# Patient Record
Sex: Male | Born: 1980 | Race: Black or African American | Hispanic: No | Marital: Single | Smoking: Never smoker
Health system: Southern US, Community
[De-identification: ages and names within clinical notes are randomized; demographics above are authoritative.]

## PROBLEM LIST (undated history)

## (undated) DIAGNOSIS — C8590 Non-Hodgkin lymphoma, unspecified, unspecified site: Secondary | ICD-10-CM

---

## 2005-09-16 ENCOUNTER — Emergency Department (HOSPITAL_COMMUNITY): Admission: EM | Admit: 2005-09-16 | Discharge: 2005-09-16 | Payer: Self-pay | Admitting: Emergency Medicine

## 2012-09-06 ENCOUNTER — Emergency Department (HOSPITAL_COMMUNITY)
Admission: EM | Admit: 2012-09-06 | Discharge: 2012-09-06 | Disposition: A | Discharge status: Home / Self Care | Payer: No Typology Code available for payment source | Attending: Emergency Medicine | Admitting: Emergency Medicine

## 2012-09-06 DIAGNOSIS — M5380 Other specified dorsopathies, site unspecified: Secondary | ICD-10-CM | POA: Insufficient documentation

## 2012-09-06 DIAGNOSIS — M545 Low back pain: Secondary | ICD-10-CM

## 2012-09-06 DIAGNOSIS — M62830 Muscle spasm of back: Secondary | ICD-10-CM

## 2012-09-06 DIAGNOSIS — S39012A Strain of muscle, fascia and tendon of lower back, initial encounter: Secondary | ICD-10-CM

## 2012-09-06 DIAGNOSIS — S335XXA Sprain of ligaments of lumbar spine, initial encounter: Secondary | ICD-10-CM | POA: Insufficient documentation

## 2012-09-06 DIAGNOSIS — Y92410 Unspecified street and highway as the place of occurrence of the external cause: Secondary | ICD-10-CM | POA: Insufficient documentation

## 2012-09-06 DIAGNOSIS — Y9389 Activity, other specified: Secondary | ICD-10-CM | POA: Insufficient documentation

## 2012-09-06 MED ORDER — NAPROXEN 500 MG PO TABS: 500.0000 mg | ORAL_TABLET | Freq: Two times a day (BID) | ORAL | Status: DC | PRN

## 2012-09-06 MED ORDER — METHOCARBAMOL 750 MG PO TABS: 750.0000 mg | ORAL_TABLET | Freq: Four times a day (QID) | ORAL | Status: DC | PRN

## 2012-09-06 NOTE — ED Provider Notes (Signed)
Medical screening examination/treatment/procedure(s) were performed by non-physician practitioner and as supervising physician I was immediately available for consultation/collaboration.    Carla Rashad R Shaquasia Caponigro, MD 09/06/12 2331 
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
## 2013-02-05 ENCOUNTER — Emergency Department (HOSPITAL_COMMUNITY): Payer: BC Managed Care – PPO

## 2013-02-05 ENCOUNTER — Emergency Department (HOSPITAL_COMMUNITY)
Admission: EM | Admit: 2013-02-05 | Discharge: 2013-02-05 | Disposition: A | Discharge status: Home / Self Care | Payer: BC Managed Care – PPO | Attending: Emergency Medicine | Admitting: Emergency Medicine

## 2013-02-05 ENCOUNTER — Encounter (HOSPITAL_COMMUNITY): Payer: Self-pay | Admitting: Family Medicine

## 2013-02-05 DIAGNOSIS — S8992XA Unspecified injury of left lower leg, initial encounter: Secondary | ICD-10-CM

## 2013-02-05 DIAGNOSIS — Z87828 Personal history of other (healed) physical injury and trauma: Secondary | ICD-10-CM | POA: Insufficient documentation

## 2013-02-05 DIAGNOSIS — Y9302 Activity, running: Secondary | ICD-10-CM | POA: Insufficient documentation

## 2013-02-05 DIAGNOSIS — S8990XA Unspecified injury of unspecified lower leg, initial encounter: Secondary | ICD-10-CM | POA: Insufficient documentation

## 2013-02-05 DIAGNOSIS — X503XXA Overexertion from repetitive movements, initial encounter: Secondary | ICD-10-CM | POA: Insufficient documentation

## 2013-02-05 DIAGNOSIS — Y9289 Other specified places as the place of occurrence of the external cause: Secondary | ICD-10-CM | POA: Insufficient documentation

## 2013-02-05 DIAGNOSIS — Z87898 Personal history of other specified conditions: Secondary | ICD-10-CM | POA: Insufficient documentation

## 2013-02-05 HISTORY — DX: Non-Hodgkin lymphoma, unspecified, unspecified site: C85.90

## 2013-02-05 MED ORDER — IBUPROFEN 800 MG PO TABS: 800.0000 mg | ORAL_TABLET | Freq: Four times a day (QID) | ORAL | Status: DC | PRN

## 2013-02-05 MED ORDER — IBUPROFEN 800 MG PO TABS
800.0000 mg | ORAL_TABLET | Freq: Once | ORAL | Status: AC
  Administered 2013-02-05: 800 mg via ORAL
  Filled 2013-02-05: qty 1

## 2013-02-05 NOTE — ED Notes (Signed)
Ortho tech at bedside to apply knee immobilizer.  
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
## 2013-02-05 NOTE — ED Notes (Signed)
Patient states he has had left knee pain x 3 weeks. States that he has recently started running and pain started after that. States pain with movement and weight-bearing. Has not taken any meds for pain.

## 2013-02-05 NOTE — ED Notes (Signed)
Patient transported to X-ray 

## 2013-02-07 NOTE — ED Provider Notes (Signed)
Medical screening examination/treatment/procedure(s) were performed by non-physician practitioner and as supervising physician I was immediately available for consultation/collaboration.   Nazyia Gaugh M Latoria Dry, MD 02/07/13 2251 

## 2014-01-29 IMAGING — CR DG KNEE COMPLETE 4+V*L*
4 series · 4 of 4 positions shown · non-contrast
Comparison: None.

CLINICAL DATA: Hurt knee during exercise

LEFT KNEE - COMPLETE 4+ VIEW

[t knee ap left]
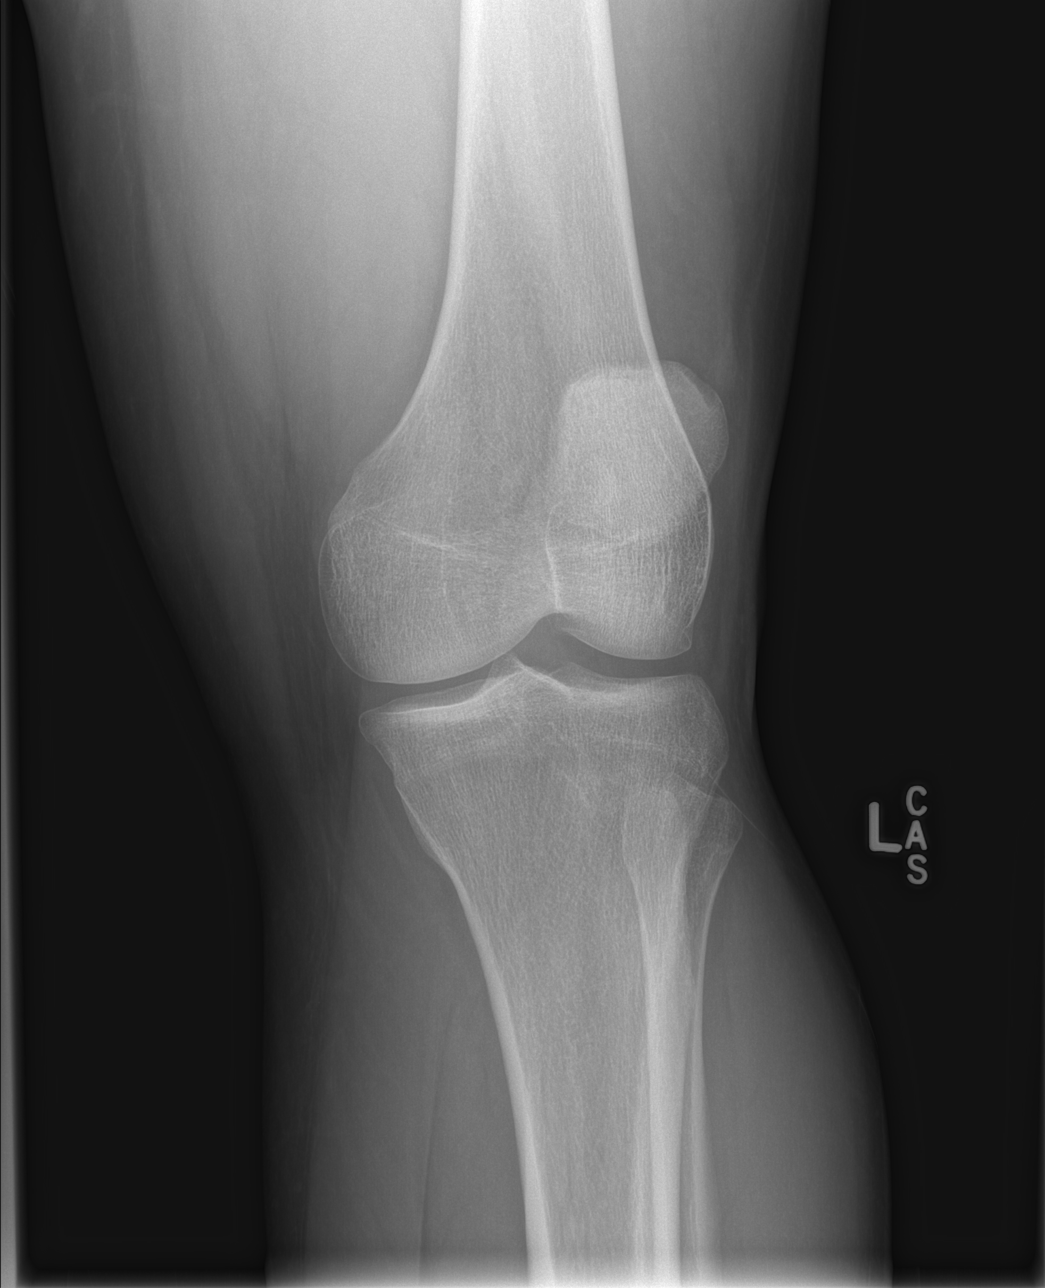

[t knee obl left (1 of 2)]
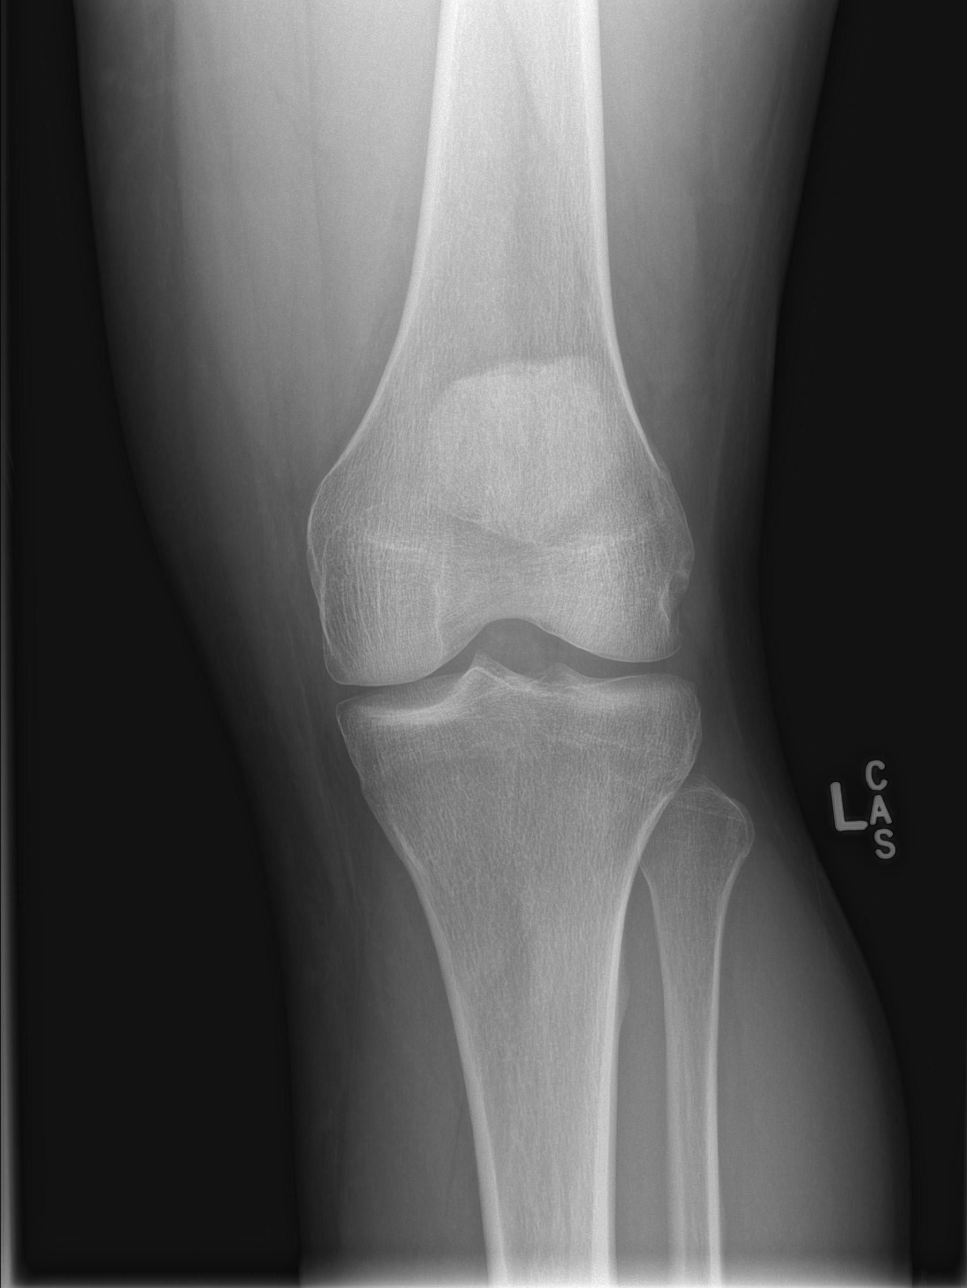

[t knee obl left (2 of 2)]
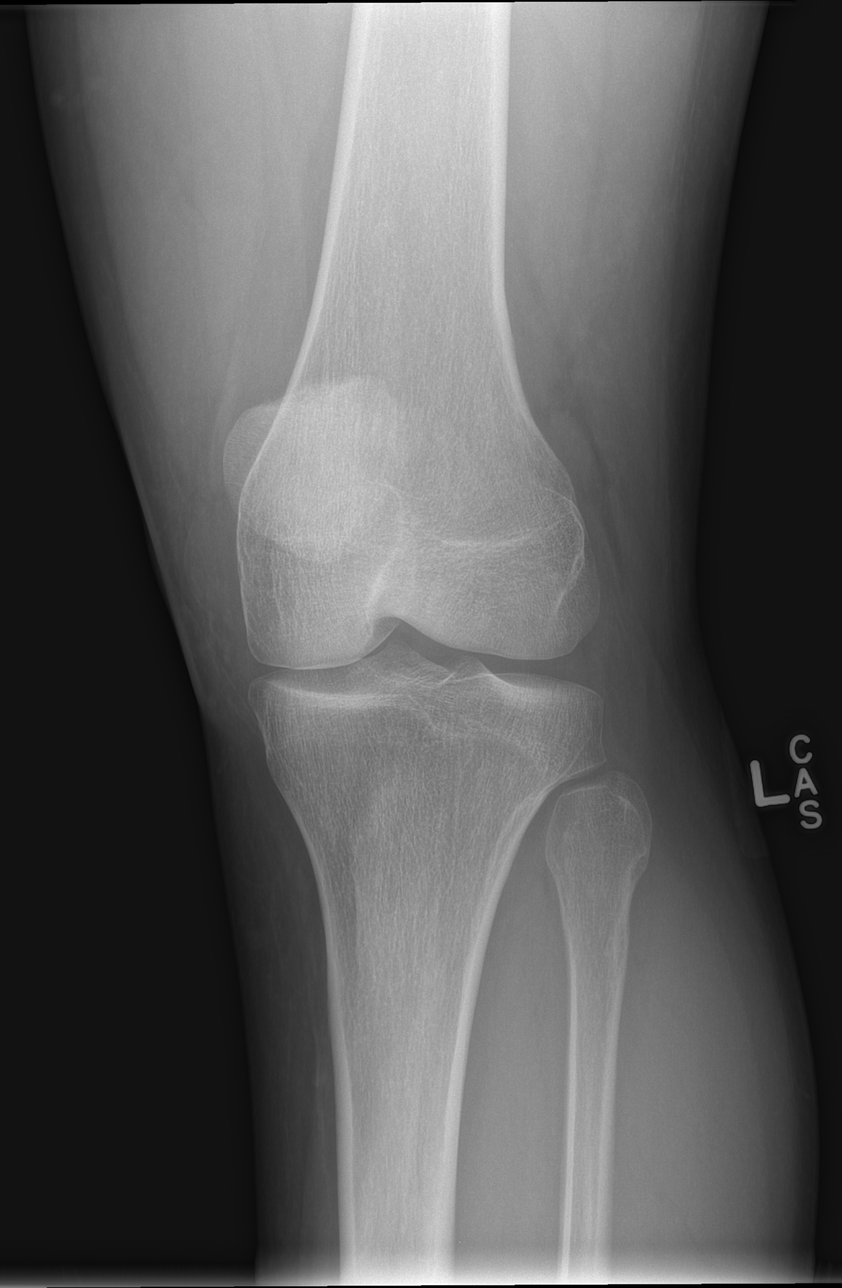

[t knee lat left]
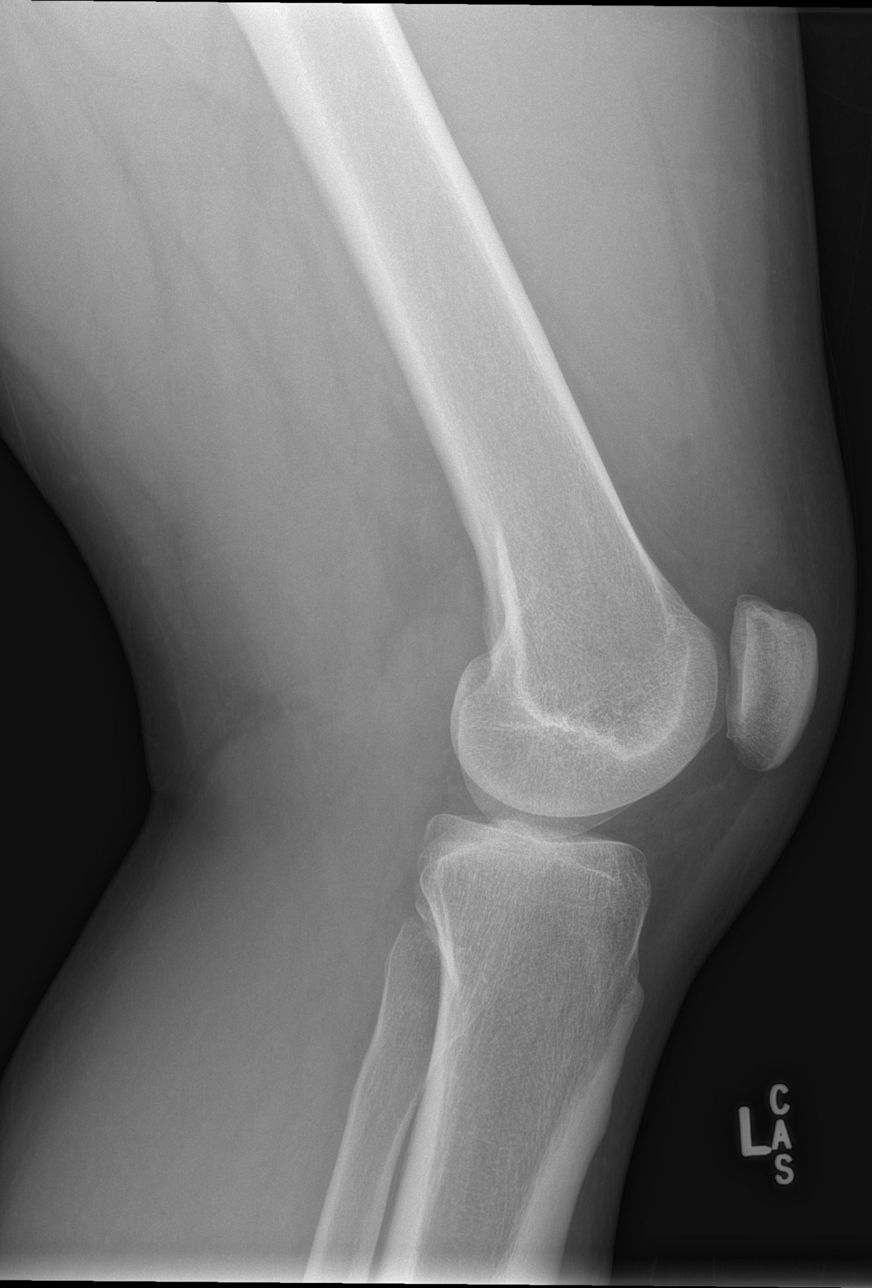

[4 of 4 positions shown; findings below may reference images not displayed]

FINDINGS: No fracture or dislocation.  Joint spaces are preserved.
No erosions.  No evidence of chondrocalcinosis.  No joint effusion.
Regional soft tissues are normal.
IMPRESSION: Normal radiographs of the left knee.

## 2014-11-19 ENCOUNTER — Emergency Department (HOSPITAL_COMMUNITY)
Admission: EM | Admit: 2014-11-19 | Discharge: 2014-11-19 | Disposition: A | Discharge status: Home / Self Care | Payer: BLUE CROSS/BLUE SHIELD | Source: Home / Self Care | Attending: Family Medicine | Admitting: Family Medicine

## 2014-11-19 ENCOUNTER — Encounter (HOSPITAL_COMMUNITY): Payer: Self-pay | Admitting: Emergency Medicine

## 2014-11-19 DIAGNOSIS — H109 Unspecified conjunctivitis: Secondary | ICD-10-CM

## 2014-11-19 MED ORDER — TOBRAMYCIN 0.3 % OP SOLN
OPHTHALMIC | Status: AC
  Filled 2014-11-19: qty 5

## 2014-11-19 MED ORDER — TOBRAMYCIN 0.3 % OP SOLN
1.0000 [drp] | Freq: Four times a day (QID) | OPHTHALMIC | Status: DC
  Administered 2014-11-19: 1 [drp] via OPHTHALMIC

## 2014-11-19 MED ORDER — TETRACAINE HCL 0.5 % OP SOLN
OPHTHALMIC | Status: AC
  Filled 2014-11-19: qty 2

## 2014-11-19 NOTE — ED Provider Notes (Signed)
CSN: [id number]     Arrival date & time 11/19/14  1855 History   First MD Initiated Contact with Patient 11/19/14 2010     Chief Complaint  Patient presents with  . Conjunctivitis   (Consider location/radiation/quality/duration/timing/severity/associated sxs/prior Treatment) Patient is a 34 y.o. male presenting with conjunctivitis. The history is provided by the patient.  Conjunctivitis This is a new problem. The current episode started 6 to 12 hours ago. The problem has not changed since onset.Pertinent negatives include no chest pain and no abdominal pain. Associated symptoms comments: Pt with allergies, sinus problems and exposure to co-worker recently with pink eye.Marland Kitchen    Past Medical History  Diagnosis Date  . Non Hodgkin's lymphoma    History reviewed. No pertinent past surgical history. No family history on file. History  Substance Use Topics  . Smoking status: Never Smoker   . Smokeless tobacco: Not on file  . Alcohol Use: Yes     Comment: Occasional    Review of Systems  Constitutional: Negative.   HENT: Negative.   Eyes: Positive for discharge and redness. Negative for photophobia, pain, itching and visual disturbance.  Respiratory: Negative.   Cardiovascular: Negative.  Negative for chest pain.  Gastrointestinal: Negative for abdominal pain.    Allergies  Review of patient's allergies indicates no known allergies.  Home Medications   Prior to Admission medications   Medication Sig Start Date End Date Taking? Authorizing Provider  aspirin EC 81 MG tablet Take 81 mg by mouth every 6 (six) hours as needed for pain. Headache    Historical Provider, MD  ibuprofen (ADVIL,MOTRIN) 800 MG tablet Take 1 tablet (800 mg total) by mouth every 6 (six) hours as needed for pain. 02/05/13   Junius Creamer, NP  methocarbamol (ROBAXIN) 750 MG tablet Take 1 tablet (750 mg total) by mouth 4 (four) times daily as needed (Take 1 tablet every 6 hours as needed for muscle spasms.). 09/06/12    Hannah Muthersbaugh, PA-C  Multiple Vitamin (MULTIVITAMIN WITH MINERALS) TABS Take 1 tablet by mouth daily.    Historical Provider, MD  naproxen (NAPROSYN) 500 MG tablet Take 1 tablet (500 mg total) by mouth 2 (two) times daily as needed. 09/06/12   Hannah Muthersbaugh, PA-C   BP 146/91 mmHg  Pulse 74  Temp(Src) 98.2 F (36.8 C) (Oral)  Resp 16  SpO2 100% Physical Exam  Constitutional: He is oriented to person, place, and time. He appears well-developed and well-nourished. No distress.  HENT:  Head: Normocephalic.  Right Ear: External ear normal.  Left Ear: External ear normal.  Mouth/Throat: Oropharynx is clear and moist.  Eyes: EOM and lids are normal. Pupils are equal, round, and reactive to light. Lids are everted and swept, no foreign bodies found. No foreign body present in the left eye. Right conjunctiva is not injected. Left conjunctiva is injected. Left conjunctiva has no hemorrhage.  Neck: Normal range of motion. Neck supple.  Lymphadenopathy:    He has no cervical adenopathy.  Neurological: He is alert and oriented to person, place, and time.  Skin: Skin is warm and dry.  Nursing note and vitals reviewed.   ED Course  Procedures (including critical care time) Labs Review Labs Reviewed - No data to display  Imaging Review No results found.   MDM   1. Conjunctivitis of left eye        Billy Fischer, MD 11/19/14 2033

## 2014-11-19 NOTE — Discharge Instructions (Signed)
Use cool cloth soak to eye before using eye medicine, see eye doctor if further problems

## 2014-11-19 NOTE — ED Notes (Addendum)
Left eye red, noticed this am.  Patient reports being around a co-worker a few days ago that had pink eye.  No change in vision.  Does not wear contacts.  Eye is tearing

## 2016-08-06 ENCOUNTER — Emergency Department (HOSPITAL_COMMUNITY)
Admission: EM | Admit: 2016-08-06 | Discharge: 2016-08-06 | Disposition: A | Discharge status: Home / Self Care | Payer: BC Managed Care – PPO | Attending: Emergency Medicine | Admitting: Emergency Medicine

## 2016-08-06 ENCOUNTER — Emergency Department (HOSPITAL_COMMUNITY): Payer: BC Managed Care – PPO

## 2016-08-06 ENCOUNTER — Encounter (HOSPITAL_COMMUNITY): Payer: Self-pay | Admitting: *Deleted

## 2016-08-06 DIAGNOSIS — Z7982 Long term (current) use of aspirin: Secondary | ICD-10-CM | POA: Diagnosis not present

## 2016-08-06 DIAGNOSIS — Z79899 Other long term (current) drug therapy: Secondary | ICD-10-CM | POA: Insufficient documentation

## 2016-08-06 DIAGNOSIS — R69 Illness, unspecified: Secondary | ICD-10-CM

## 2016-08-06 DIAGNOSIS — M7918 Myalgia, other site: Secondary | ICD-10-CM

## 2016-08-06 DIAGNOSIS — J111 Influenza due to unidentified influenza virus with other respiratory manifestations: Secondary | ICD-10-CM | POA: Diagnosis not present

## 2016-08-06 DIAGNOSIS — R05 Cough: Secondary | ICD-10-CM | POA: Diagnosis present

## 2016-08-06 MED ORDER — OSELTAMIVIR PHOSPHATE 75 MG PO CAPS: 75.0000 mg | ORAL_CAPSULE | Freq: Two times a day (BID) | ORAL | 0 refills | Status: DC

## 2016-08-06 MED ORDER — HYDROCODONE-ACETAMINOPHEN 5-325 MG PO TABS: ORAL_TABLET | ORAL | 0 refills | Status: DC

## 2016-08-06 NOTE — Discharge Instructions (Signed)

## 2016-08-06 NOTE — ED Triage Notes (Signed)
Pt reports generalized body aches and coughing since Tuesday.  Pt reports his abd hurts like he's done "a hundred crunches."  Pt reports coughing up clear mucus. Pt reports fever and chills.  Pt a/o x 4 and ambulatory.

## 2016-08-06 NOTE — ED Provider Notes (Signed)
[facility] DEPT Provider Note   CSN: [id number] Arrival date & time: 08/06/16  1932 By signing my name below, I, Jon Alexander, attest that this documentation has been prepared under the direction and in the presence of non-physician practitioner, Jon Blitz, PA-C. Electronically Signed: Dyke Alexander, Scribe. 08/06/2016. 8:18 PM.   History   Chief Complaint Chief Complaint  Patient presents with  . Cough   HPI Jon Alexander is a 36 y.o. male who presents to the Emergency Department complaining of intermittent, moderate generalized body aches which began two days ago. He notes associated cough productive of clear sputum, chills, subjective fever, and cramping abodminal pain secondary to cough. Pt has taken Alka Seltzer Cold and Flu with no relief of symptoms. He reports positive sick contact. Pt is a nonsmoker and is not on any daily medications. Pt denies nausea, vomiting, diarrhea, or dysuria. He has no other complaints at this time.   The history is provided by the patient. No language interpreter was used.   Past Medical History:  Diagnosis Date  . Non Hodgkin's lymphoma ([place])    There are no active problems to display for this patient.  History reviewed. No pertinent surgical history.   Home Medications    Prior to Admission medications   Medication Sig Start Date End Date Taking? Authorizing Provider  aspirin EC 81 MG tablet Take 81 mg by mouth every 6 (six) hours as needed for pain. Headache    Historical Provider, MD  HYDROcodone-acetaminophen (NORCO/VICODIN) 5-325 MG tablet Take 1-2 tablets by mouth every 6 hours as needed for pain. 08/06/16   Jon Trevino, PA-C  ibuprofen (ADVIL,MOTRIN) 800 MG tablet Take 1 tablet (800 mg total) by mouth every 6 (six) hours as needed for pain. 02/05/13   Jon Creamer, NP  methocarbamol (ROBAXIN) 750 MG tablet Take 1 tablet (750 mg total) by mouth 4 (four) times daily as needed (Take 1 tablet every 6 hours as needed for muscle  spasms.). 09/06/12   Jon Muthersbaugh, PA-C  Multiple Vitamin (MULTIVITAMIN WITH MINERALS) TABS Take 1 tablet by mouth daily.    Historical Provider, MD  naproxen (NAPROSYN) 500 MG tablet Take 1 tablet (500 mg total) by mouth 2 (two) times daily as needed. 09/06/12   Jon Muthersbaugh, PA-C  oseltamivir (TAMIFLU) 75 MG capsule Take 1 capsule (75 mg total) by mouth every 12 (twelve) hours. 08/06/16   Jon Ricks Sadie Pickar, PA-C    Family History No family history on file.  Social History Social History  Substance Use Topics  . Smoking status: Never Smoker  . Smokeless tobacco: Never Used  . Alcohol use Yes     Comment: Occasional    Allergies   Patient has no known allergies.  Review of Systems Review of Systems 10 systems reviewed and all are negative for acute change except as noted in the HPI.   Physical Exam Updated Vital Signs BP 144/86   Pulse 89   Temp 99.5 F (37.5 C)   Resp 16   SpO2 100%   Physical Exam  Constitutional: He is oriented to person, place, and time. He appears well-developed and well-nourished. No distress.  HENT:  Head: Normocephalic and atraumatic.  Right Ear: External ear normal.  Left Ear: External ear normal.  Mouth/Throat: Oropharynx is clear and moist. No oropharyngeal exudate.  No drooling or stridor. Posterior pharynx mildly erythematous no significant tonsillar hypertrophy. No exudate. Soft palate rises symmetrically. No TTP or induration under tongue.   No tenderness to palpation of frontal or  bilateral maxillary sinuses.  Mild mucosal edema in the nares with scant rhinorrhea.  Bilateral tympanic membranes with normal architecture and good light reflex.    Eyes: Conjunctivae and EOM are normal. Pupils are equal, round, and reactive to light.  Neck: Normal range of motion. Neck supple.  Cardiovascular: Normal rate and regular rhythm.   Pulmonary/Chest: Effort normal and breath sounds normal. No stridor. No respiratory distress. He has  no wheezes. He has no rales. He exhibits no tenderness.  Abdominal: Soft. He exhibits no distension. There is no tenderness. There is no rebound and no guarding.  Neurological: He is alert and oriented to person, place, and time.  Skin: Skin is warm and dry.  Psychiatric: He has a normal mood and affect.  Nursing note and vitals reviewed.  ED Treatments / Results  DIAGNOSTIC STUDIES:  Oxygen Saturation is 97% on RA, normal by my interpretation.    COORDINATION OF CARE:  8:17 PM Discussed treatment plan which includes [place] with pt at bedside and pt agreed to plan.   Labs (all labs ordered are listed, but only abnormal results are displayed) Labs Reviewed - No data to display  EKG  EKG Interpretation None       Radiology Dg Chest 2 View  Result Date: 08/06/2016 CLINICAL DATA:  Cough and mid chest pain with shortness of breath EXAM: CHEST  2 VIEW COMPARISON:  CT 09/16/2005, chest x-ray 09/16/2005 FINDINGS: Right-sided central venous catheter with tip projecting over the right subclavian region. Low lung volumes. No acute infiltrate or effusion. Cardiomediastinal silhouette within normal limits. No pneumothorax. IMPRESSION: 1. Low lung volumes without acute infiltrate 2. Right-sided central venous catheter appears short, the tip is positioned over the right subclavian region. Electronically Signed   By: Donavan Foil M.D.   On: 08/06/2016 20:58    Procedures Procedures (including critical care time)  Medications Ordered in ED Medications - No data to display   Initial Impression / Assessment and Plan / ED Course  I have reviewed the triage vital signs and the nursing notes.  Pertinent labs & imaging results that were available during my care of the patient were reviewed by me and considered in my medical decision making (see chart for details).     Vitals:   08/06/16 1938 08/06/16 2021  BP: 161/100 144/86  Pulse: 91 89  Resp: 18 16  Temp: 98.7 F (37.1 C) 99.5 F  (37.5 C)  TempSrc: Oral   SpO2: 97% 100%    Medications - No data to display  Jon Alexander is 36 y.o. male presenting with Influenza-like illness, lung sounds clear, vital signs reassuring, chest x-ray without infiltrate. Patient started on Tamiflu and extensive discussion of aggressive hydration and return precautions. Reporting pleuritic chest pain and abdominal pain when he coughs, think this is likely musculoskeletal in nature, my abdominal exam is completely benign.  Evaluation does not show pathology that would require ongoing emergent intervention or inpatient treatment. Pt is hemodynamically stable and mentating appropriately. Discussed findings and plan with patient/guardian, who agrees with care plan. All questions answered. Return precautions discussed and outpatient follow up given.     Final Clinical Impressions(s) / ED Diagnoses   Final diagnoses:  Influenza-like illness  Musculoskeletal pain    New Prescriptions Discharge Medication List as of 08/06/2016  8:36 PM    START taking these medications   Details  HYDROcodone-acetaminophen (NORCO/VICODIN) 5-325 MG tablet Take 1-2 tablets by mouth every 6 hours as needed for pain., Print  oseltamivir (TAMIFLU) 75 MG capsule Take 1 capsule (75 mg total) by mouth every 12 (twelve) hours., Starting Thu 08/06/2016, Print       I personally performed the services described in this documentation, which was scribed in my presence. The recorded information has been reviewed and is accurate.    Jon Blitz, PA-C 08/06/16 2356    Sherwood Gambler, MD 08/07/16 [phone]

## 2017-07-30 IMAGING — CR DG CHEST 2V
2 series · 2 of 2 positions shown · non-contrast
Comparison: CT 09/16/2005, chest x-ray 09/16/2005

CLINICAL DATA: Cough and mid chest pain with shortness of breath

EXAM:
CHEST  2 VIEW

[w chest pa]
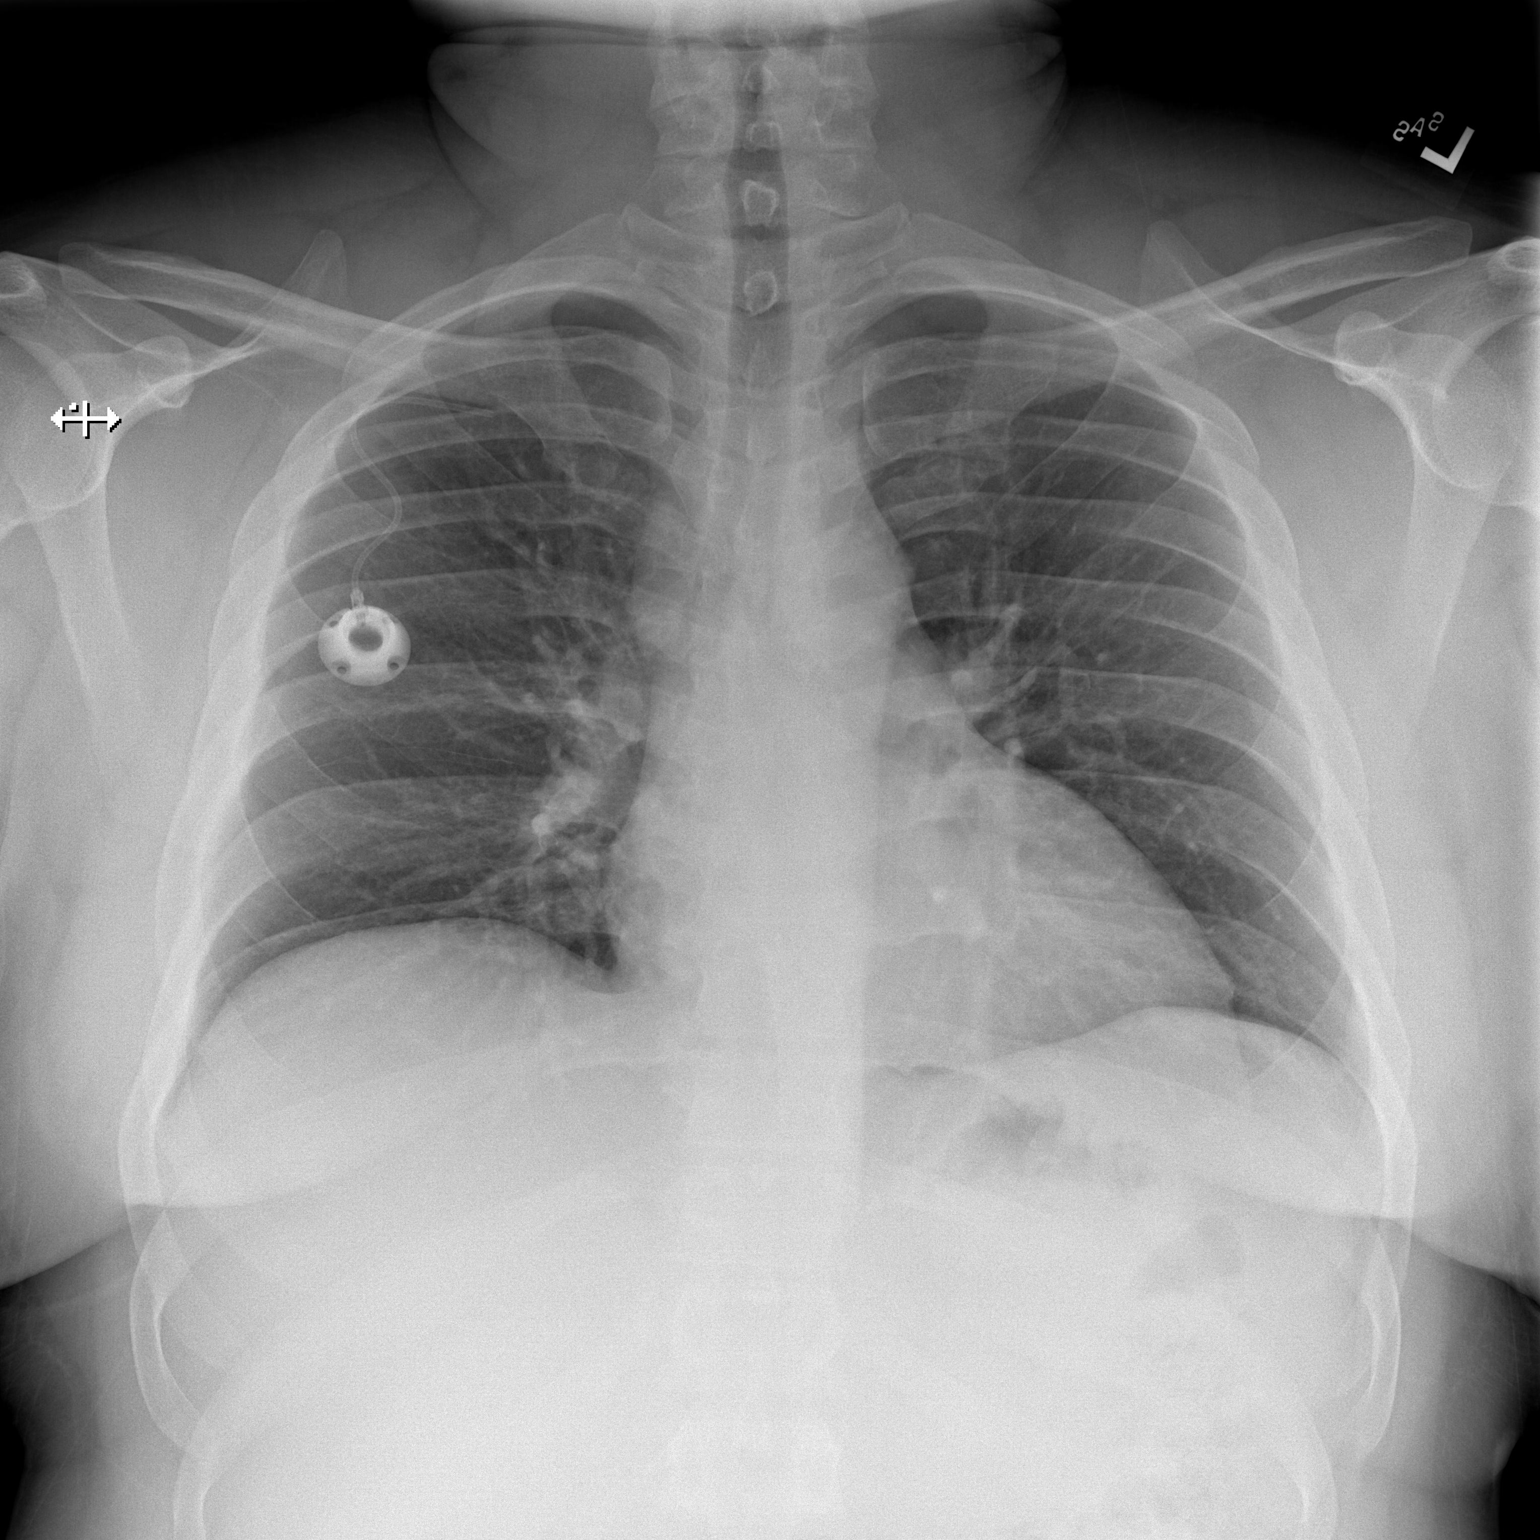

[w chest lat]
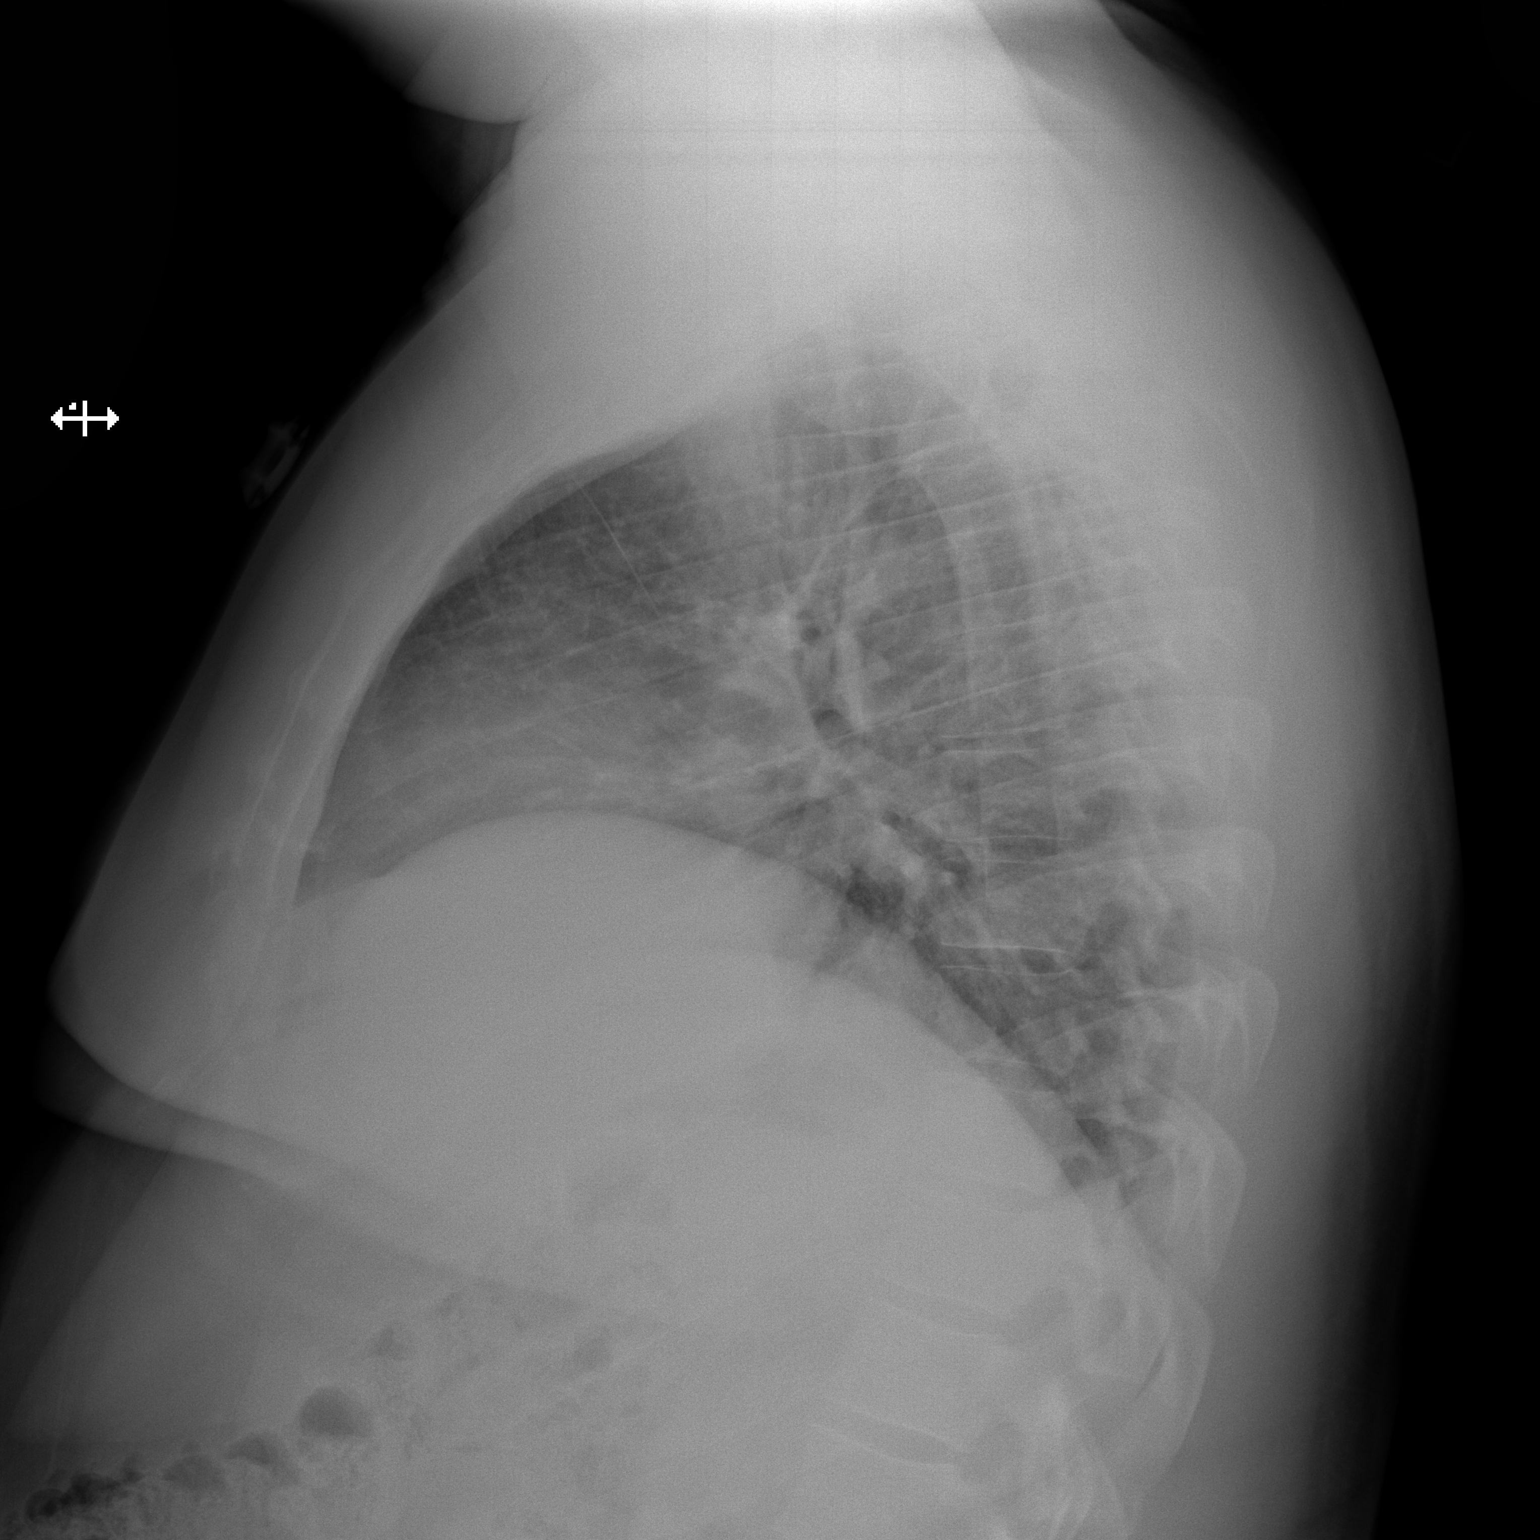

[2 of 2 positions shown; findings below may reference images not displayed]

FINDINGS: Right-sided central venous catheter with tip projecting over the
right subclavian region. Low lung volumes. No acute infiltrate or
effusion. Cardiomediastinal silhouette within normal limits. No
pneumothorax.
IMPRESSION: 1. Low lung volumes without acute infiltrate
2. Right-sided central venous catheter appears short, the tip is
positioned over the right subclavian region.

## 2019-09-16 ENCOUNTER — Other Ambulatory Visit: Payer: Self-pay

## 2019-09-16 ENCOUNTER — Ambulatory Visit (HOSPITAL_COMMUNITY)
Admission: EM | Admit: 2019-09-16 | Discharge: 2019-09-16 | Disposition: A | Discharge status: Home / Self Care | Payer: BC Managed Care – PPO | Attending: Family Medicine | Admitting: Family Medicine

## 2019-09-16 ENCOUNTER — Encounter (HOSPITAL_COMMUNITY): Payer: Self-pay

## 2019-09-16 DIAGNOSIS — K602 Anal fissure, unspecified: Secondary | ICD-10-CM

## 2019-09-16 MED ORDER — HYDROCORTISONE (PERIANAL) 2.5 % EX CREA: 1.0000 "application " | TOPICAL_CREAM | Freq: Two times a day (BID) | CUTANEOUS | 0 refills | Status: AC

## 2019-09-16 NOTE — ED Triage Notes (Signed)
Pt is here with hemorrhoids that started 10 days ago states he probably lifted to much weight at the gym. Pt has taken fiber to relieve discomfort.

## 2019-09-16 NOTE — ED Provider Notes (Signed)
[facility]    CSN: [id number] Arrival date & time: 09/16/19  1236      History   Chief Complaint Chief Complaint  Patient presents with  . Hemorrhoids    HPI Jon Alexander is a 39 y.o. male.   Initial [facility] patient visit  Pt is here with hemorrhoids that started 10 days ago states he probably lifted to much weight at the gym. Pt has taken fiber to relieve discomfort.  No significant bleeding.  No fever.  Works for job [employer], historic buildings     Past Medical History:  Diagnosis Date  . Non Hodgkin's lymphoma ([place])     There are no problems to display for this patient.   History reviewed. No pertinent surgical history.     Home Medications    Prior to Admission medications   Medication Sig Start Date End Date Taking? Authorizing Provider  hydrocortisone (ANUSOL-HC) 2.5 % rectal cream Place 1 application rectally 2 (two) times daily. 09/16/19   Robyn Haber, MD  Multiple Vitamin (MULTIVITAMIN WITH MINERALS) TABS Take 1 tablet by mouth daily.    [provider]    Family History Family History  Problem Relation Age of Onset  . Hypertension Mother   . Healthy Father     Social History Social History   Tobacco Use  . Smoking status: Never Smoker  . Smokeless tobacco: Never Used  Substance Use Topics  . Alcohol use: Yes    Comment: Occasional  . Drug use: No     Allergies   Patient has no known allergies.   Review of Systems Review of Systems  Constitutional: Negative.   Gastrointestinal: Positive for rectal pain. Negative for abdominal distention, abdominal pain and anal bleeding.     Physical Exam Triage Vital Signs ED Triage Vitals  Enc Vitals Group     BP 09/16/19 1340 (!) 162/94     Pulse Rate 09/16/19 1340 88     Resp 09/16/19 1340 (!) 22     Temp 09/16/19 1340 98.6 F (37 C)     Temp Source 09/16/19 1340 Oral     SpO2 09/16/19 1340 96 %     Weight 09/16/19 1337 260 lb (117.9 kg)     Height --      Head Circumference --       Peak Flow --      Pain Score 09/16/19 1337 0     Pain Loc --      Pain Edu? --      Excl. in [place]? --    No data found.  Updated Vital Signs BP (!) 162/94 (BP Location: Right Arm)   Pulse 88   Temp 98.6 F (37 C) (Oral)   Resp (!) 22   Wt 117.9 kg   SpO2 96%   BMI 36.26 kg/m    Physical Exam Vitals and nursing note reviewed.  Constitutional:      Appearance: Normal appearance. He is obese.  HENT:     Head: Normocephalic.     Mouth/Throat:     Mouth: Mucous membranes are moist.  Eyes:     Conjunctiva/sclera: Conjunctivae normal.  Cardiovascular:     Rate and Rhythm: Normal rate.  Pulmonary:     Effort: Pulmonary effort is normal.  Genitourinary:    Comments: Small fissure at 6 o'clock Musculoskeletal:        General: Normal range of motion.     Cervical back: Normal range of motion and neck supple.  Skin:  General: Skin is warm and dry.  Neurological:     General: No focal deficit present.     Mental Status: He is alert.  Psychiatric:        Mood and Affect: Mood normal.        Behavior: Behavior normal.        Thought Content: Thought content normal.      UC Treatments / Results  Labs (all labs ordered are listed, but only abnormal results are displayed) Labs Reviewed - No data to display  EKG   Radiology No results found.  Procedures Procedures (including critical care time)  Medications Ordered in UC Medications - No data to display  Initial Impression / Assessment and Plan / UC Course  I have reviewed the triage vital signs and the nursing notes.  Pertinent labs & imaging results that were available during my care of the patient were reviewed by me and considered in my medical decision making (see chart for details).    Final Clinical Impressions(s) / UC Diagnoses   Final diagnoses:  Anal fissure     Discharge Instructions     Rinse well at end of shower.    ED Prescriptions    Medication Sig Dispense Auth. Provider    hydrocortisone (ANUSOL-HC) 2.5 % rectal cream Place 1 application rectally 2 (two) times daily. 30 g Robyn Haber, MD     I have reviewed the PDMP during this encounter.   Robyn Haber, MD 09/16/19 1410

## 2019-09-16 NOTE — Discharge Instructions (Addendum)
Rinse well at end of shower.

## 2019-09-27 ENCOUNTER — Ambulatory Visit: Payer: BLUE CROSS/BLUE SHIELD | Attending: Family

## 2019-09-27 DIAGNOSIS — Z20822 Contact with and (suspected) exposure to covid-19: Secondary | ICD-10-CM

## 2019-09-28 LAB — NOVEL CORONAVIRUS, NAA: SARS-CoV-2, NAA: NOT DETECTED

## 2019-09-28 LAB — SARS-COV-2, NAA 2 DAY TAT

## 2019-11-01 ENCOUNTER — Ambulatory Visit: Payer: BLUE CROSS/BLUE SHIELD | Attending: Internal Medicine

## 2019-11-01 DIAGNOSIS — Z20822 Contact with and (suspected) exposure to covid-19: Secondary | ICD-10-CM

## 2019-11-02 LAB — NOVEL CORONAVIRUS, NAA: SARS-CoV-2, NAA: NOT DETECTED

## 2019-11-02 LAB — SARS-COV-2, NAA 2 DAY TAT

## 2019-11-15 ENCOUNTER — Other Ambulatory Visit: Payer: Self-pay

## 2019-11-15 ENCOUNTER — Ambulatory Visit: Payer: BLUE CROSS/BLUE SHIELD | Attending: Internal Medicine

## 2019-11-15 DIAGNOSIS — Z20822 Contact with and (suspected) exposure to covid-19: Secondary | ICD-10-CM

## 2019-11-16 LAB — SARS-COV-2, NAA 2 DAY TAT

## 2019-11-16 LAB — NOVEL CORONAVIRUS, NAA: SARS-CoV-2, NAA: NOT DETECTED

## 2019-12-13 ENCOUNTER — Ambulatory Visit: Payer: BLUE CROSS/BLUE SHIELD | Attending: Internal Medicine

## 2019-12-13 DIAGNOSIS — Z20822 Contact with and (suspected) exposure to covid-19: Secondary | ICD-10-CM

## 2019-12-14 LAB — NOVEL CORONAVIRUS, NAA: SARS-CoV-2, NAA: NOT DETECTED

## 2019-12-14 LAB — SARS-COV-2, NAA 2 DAY TAT

## 2020-01-03 ENCOUNTER — Other Ambulatory Visit: Payer: BLUE CROSS/BLUE SHIELD

## 2020-01-10 ENCOUNTER — Ambulatory Visit: Payer: BLUE CROSS/BLUE SHIELD | Attending: Internal Medicine

## 2020-01-10 DIAGNOSIS — Z20822 Contact with and (suspected) exposure to covid-19: Secondary | ICD-10-CM

## 2020-01-11 LAB — NOVEL CORONAVIRUS, NAA: SARS-CoV-2, NAA: NOT DETECTED

## 2020-01-11 LAB — SARS-COV-2, NAA 2 DAY TAT

## 2020-03-15 DEATH — deceased
